# Patient Record
Sex: Male | Born: 1990 | Hispanic: Yes | Marital: Married | Smoking: Never smoker
Health system: Southern US, Community
[De-identification: ages and names within clinical notes are randomized; demographics above are authoritative.]

---

## 2017-12-22 ENCOUNTER — Emergency Department: Payer: No Typology Code available for payment source

## 2017-12-22 ENCOUNTER — Encounter: Payer: Self-pay | Admitting: Emergency Medicine

## 2017-12-22 ENCOUNTER — Other Ambulatory Visit: Payer: Self-pay

## 2017-12-22 ENCOUNTER — Emergency Department
Admission: EM | Admit: 2017-12-22 | Discharge: 2017-12-23 | Disposition: A | Discharge status: Home / Self Care | Payer: No Typology Code available for payment source | Attending: Emergency Medicine | Admitting: Emergency Medicine

## 2017-12-22 DIAGNOSIS — Y990 Civilian activity done for income or pay: Secondary | ICD-10-CM | POA: Insufficient documentation

## 2017-12-22 DIAGNOSIS — W208XXA Other cause of strike by thrown, projected or falling object, initial encounter: Secondary | ICD-10-CM | POA: Insufficient documentation

## 2017-12-22 DIAGNOSIS — Y939 Activity, unspecified: Secondary | ICD-10-CM | POA: Diagnosis not present

## 2017-12-22 DIAGNOSIS — Y929 Unspecified place or not applicable: Secondary | ICD-10-CM | POA: Insufficient documentation

## 2017-12-22 DIAGNOSIS — S41112A Laceration without foreign body of left upper arm, initial encounter: Secondary | ICD-10-CM

## 2017-12-22 MED ORDER — LIDOCAINE HCL (PF) 1 % IJ SOLN
5.0000 mL | Freq: Once | INTRAMUSCULAR | Status: AC
  Administered 2017-12-22: 5 mL

## 2017-12-22 MED ORDER — LIDOCAINE HCL (PF) 1 % IJ SOLN
INTRAMUSCULAR | Status: AC
  Administered 2017-12-22: 5 mL
  Filled 2017-12-22: qty 5

## 2017-12-22 NOTE — ED Triage Notes (Signed)
Patient ambulatory to triage with steady gait, without difficulty or distress noted; pt reports being cut on left upper arm by cylinder at work; pt accomp by supervisor with [employer](Ralph Conley [phone]); workers comp profile indicates UDS required and company to provide COC form; supervisor has no form and st to use ours

## 2017-12-23 MED ORDER — CEPHALEXIN 500 MG PO CAPS: 500.0000 mg | ORAL_CAPSULE | Freq: Three times a day (TID) | ORAL | 0 refills | Status: AC

## 2017-12-23 NOTE — ED Provider Notes (Signed)
[facility] Emergency Department Provider Note  ____________________________________________  Time seen: Approximately 12:23 AM  I have reviewed the triage vital signs and the nursing notes.   HISTORY  Chief Complaint Laceration    HPI Tony Perez is a 27 y.o. male presents to the emergency department with a 2 cm superficial laceration of the left upper arm after patient reports that a cylinder dropped on him at work.  Patient denies weakness, radiculopathy or changes in sensation of the upper extremities.  His tetanus status is up-to-date.  No alleviating measures have been attempted aside from a clean dressing.   History reviewed. No pertinent past medical history.  There are no active problems to display for this patient.   History reviewed. No pertinent surgical history.  Prior to Admission medications   Medication Sig Start Date End Date Taking? Authorizing Provider  cephALEXin (KEFLEX) 500 MG capsule Take 1 capsule (500 mg total) by mouth 3 (three) times daily for 10 days. 12/23/17 01/02/18  Orvil FeilWoods, Flemon Kelty M, PA-C    Allergies Patient has no known allergies.  No family history on file.  Social History Social History   Tobacco Use  . Smoking status: Never Smoker  . Smokeless tobacco: Never Used  Substance Use Topics  . Alcohol use: Not on file  . Drug use: Not on file     Review of Systems  Constitutional: No fever/chills Eyes: No visual changes. No discharge ENT: No upper respiratory complaints. Cardiovascular: no chest pain. Respiratory: no cough. No SOB. Gastrointestinal: No abdominal pain.  No nausea, no vomiting.  No diarrhea.  No constipation. Musculoskeletal: Negative for musculoskeletal pain. Skin: Patient has left upper arm laceration.  Neurological: Negative for headaches, focal weakness or numbness.   ____________________________________________   PHYSICAL EXAM:  VITAL SIGNS: ED Triage Vitals  Enc Vitals Group      BP 12/22/17 2237 (!) 145/79     Pulse Rate 12/22/17 2237 (!) 112     Resp 12/22/17 2237 18     Temp 12/22/17 2237 98.6 F (37 C)     Temp Source 12/22/17 2237 Oral     SpO2 12/22/17 2237 99 %     Weight 12/22/17 2219 165 lb (74.8 kg)     Height 12/22/17 2219 5\' 5"  (1.651 m)     Head Circumference --      Peak Flow --      Pain Score 12/22/17 2219 6     Pain Loc --      Pain Edu? --      Excl. in GC? --      Constitutional: Alert and oriented. Well appearing and in no acute distress. Eyes: Conjunctivae are normal. PERRL. EOMI. Head: Atraumatic. Cardiovascular: Normal rate, regular rhythm. Normal S1 and S2.  Good peripheral circulation. Respiratory: Normal respiratory effort without tachypnea or retractions. Lungs CTAB. Good air entry to the bases with no decreased or absent breath sounds. Musculoskeletal: Full range of motion to all extremities. No gross deformities appreciated. Neurologic:  Normal speech and language. No gross focal neurologic deficits are appreciated.  Skin: Patient has 2 cm laceration deep to the dermis at left upper arm.  Laceration is linear in nature. Psychiatric: Mood and affect are normal. Speech and behavior are normal. Patient exhibits appropriate insight and judgement.   ____________________________________________   LABS (all labs ordered are listed, but only abnormal results are displayed)  Labs Reviewed - No data to display ____________________________________________  EKG   ____________________________________________  RADIOLOGY I personally  viewed and evaluated these images as part of my medical decision making, as well as reviewing the written report by the radiologist.  Dg Humerus Left  Result Date: 12/22/2017 CLINICAL DATA:  Patient sustained a cut in the left upper arm by cylinder at work. EXAM: LEFT HUMERUS - 2+ VIEW COMPARISON:  None. FINDINGS: There is no evidence of fracture or other focal bone lesions. Overlying bandaging is  noted of the distal left arm along the radial and dorsal aspect with soft tissue defect likely representing site of soft tissue injury/laceration along the posterior aspect of the distal left arm. No radiopaque foreign body. IMPRESSION: Soft tissue laceration of the dorsal distal left arm without radiopaque foreign body. No acute osseous abnormality. Electronically Signed   By: Tollie Eth M.D.   On: 12/22/2017 23:28    ____________________________________________    PROCEDURES  Procedure(s) performed:    Procedures  LACERATION REPAIR Performed by: Orvil Feil Authorized by: Orvil Feil Consent: Verbal consent obtained. Risks and benefits: risks, benefits and alternatives were discussed Consent given by: patient Patient identity confirmed: provided demographic data Prepped and Draped in normal sterile fashion Wound explored  Laceration Location: Left upper arm   Laceration Length: 2 cm  No Foreign Bodies seen or palpated  Anesthesia: local infiltration  Local anesthetic: lidocaine 1% without epinephrine  Anesthetic total: 3 ml  Irrigation method: syringe Amount of cleaning: standard  Skin closure: 4-0 Ethilon   Number of sutures: 4  Technique: Simple Interrupted   Patient tolerance: Patient tolerated the procedure well with no immediate complications.   Medications  lidocaine (PF) (XYLOCAINE) 1 % injection 5 mL (5 mLs Other Given 12/22/17 2330)     ____________________________________________   INITIAL IMPRESSION / ASSESSMENT AND PLAN / ED COURSE  Pertinent labs & imaging results that were available during my care of the patient were reviewed by me and considered in my medical decision making (see chart for details).  Review of the [place] CSRS was performed in accordance of the [facility] prior to dispensing any controlled drugs.      Assessment and Plan: Laceration Patient presents to the emergency department with a 2 cm left upper arm laceration repaired  in the emergency department without complication.  Patient was advised to have sutures removed by primary care in 7 days.  He was discharged with Keflex. All patient questions were answered.     ____________________________________________  FINAL CLINICAL IMPRESSION(S) / ED DIAGNOSES  Final diagnoses:  Laceration of left upper arm, initial encounter      NEW MEDICATIONS STARTED DURING THIS VISIT:  ED Discharge Orders        Ordered    cephALEXin (KEFLEX) 500 MG capsule  3 times daily     12/23/17 0011          This chart was dictated using voice recognition software/Dragon. Despite best efforts to proofread, errors can occur which can change the meaning. Any change was purely unintentional.    Orvil Feil, PA-C 12/23/17 1610    Minna Antis, MD 12/24/17 [phone]

## 2019-11-17 ENCOUNTER — Other Ambulatory Visit: Payer: Self-pay

## 2019-11-17 ENCOUNTER — Emergency Department: Payer: Self-pay

## 2019-11-17 DIAGNOSIS — Y998 Other external cause status: Secondary | ICD-10-CM | POA: Insufficient documentation

## 2019-11-17 DIAGNOSIS — W278XXA Contact with other nonpowered hand tool, initial encounter: Secondary | ICD-10-CM | POA: Insufficient documentation

## 2019-11-17 DIAGNOSIS — S51842A Puncture wound with foreign body of left forearm, initial encounter: Secondary | ICD-10-CM | POA: Insufficient documentation

## 2019-11-17 DIAGNOSIS — Y9269 Other specified industrial and construction area as the place of occurrence of the external cause: Secondary | ICD-10-CM | POA: Insufficient documentation

## 2019-11-17 DIAGNOSIS — Y9389 Activity, other specified: Secondary | ICD-10-CM | POA: Insufficient documentation

## 2019-11-17 NOTE — ED Notes (Signed)
Pt's supervisor Edison Nasuti [phone] reports pt does not need UDS

## 2019-11-17 NOTE — ED Triage Notes (Signed)
Patient reports injury occurred at work, he was stamping a piece of metal when a piece broke off and shot into his left arm.

## 2019-11-18 ENCOUNTER — Emergency Department
Admission: EM | Admit: 2019-11-18 | Discharge: 2019-11-18 | Disposition: A | Discharge status: Home / Self Care | Payer: Self-pay | Attending: Emergency Medicine | Admitting: Emergency Medicine

## 2019-11-18 DIAGNOSIS — S40852A Superficial foreign body of left upper arm, initial encounter: Secondary | ICD-10-CM

## 2019-11-18 MED ORDER — CEPHALEXIN 500 MG PO CAPS
500.0000 mg | ORAL_CAPSULE | Freq: Once | ORAL | Status: AC
  Administered 2019-11-18: 500 mg via ORAL

## 2019-11-18 MED ORDER — CEPHALEXIN 500 MG PO CAPS: 500.0000 mg | ORAL_CAPSULE | Freq: Two times a day (BID) | ORAL | 0 refills | Status: DC

## 2019-11-18 NOTE — ED Notes (Signed)
ED Provider at bedside. 

## 2019-11-18 NOTE — ED Provider Notes (Signed)
[facility] Emergency Department Provider Note  Time seen: 12:57 AM  I have reviewed the triage vital signs and the nursing notes.   HISTORY  Chief Complaint Foreign Body   HPI Shields Pautz is a 29 y.o. male with no significant past medical history presents to the emergency department for a injury at work.  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  While he was attempting to hammer/stamping number into the lumen of take a piece of the aluminum tank broke off and shot into his left arm.  States mild bleeding after the event.  Punctate wound to his left arm.  Patient states last tetanus shot was approximately 2 years ago.  No discomfort.  No past medical history on file.  There are no problems to display for this patient.   No past surgical history on file.  Prior to Admission medications   Not on File    No Known Allergies  No family history on file.  Social History Social History   Tobacco Use  . Smoking status: Never Smoker  . Smokeless tobacco: Never Used  Substance Use Topics  . Alcohol use: Not on file  . Drug use: Not on file    Review of Systems Constitutional: Negative for fever. Cardiovascular: Negative for chest pain. Respiratory: Negative for shortness of breath. Gastrointestinal: Negative for abdominal pain Musculoskeletal: Wound to left arm Skin: Wound to left arm All other ROS negative  ____________________________________________   PHYSICAL EXAM:  VITAL SIGNS: ED Triage Vitals  Enc Vitals Group     BP 11/17/19 2330 (!) 138/91     Pulse Rate 11/17/19 2330 (!) 108     Resp 11/17/19 2330 18     Temp 11/17/19 2330 99.4 F (37.4 C)     Temp Source 11/17/19 2330 Oral     SpO2 11/17/19 2330 98 %     Weight 11/17/19 2328 165 lb (74.8 kg)     Height 11/17/19 2328 5\' 5"  (1.651 m)     Head Circumference --      Peak Flow --       Pain Score 11/17/19 2328 7     Pain Loc --      Pain Edu? --      Excl. in GC? --    Constitutional: Alert and oriented. Well appearing and in no distress. Eyes: Normal exam ENT      Head: Normocephalic and atraumatic.      Mouth/Throat: Mucous membranes are moist. Cardiovascular: Normal rate, regular rhythm Respiratory: Normal respiratory effort without tachypnea nor retractions. Breath sounds are clear  Gastrointestinal: Soft and nontender. No distention.  Musculoskeletal: Patient has a punctate wound to the medial left forearm just distal to the elbow.  Neurovascular intact. Neurologic:  Normal speech and language. No gross focal neurologic deficits  Skin:  Skin is warm.  Punctate wound as described above hemostatic. Psychiatric: Mood and affect are normal.       RADIOLOGY  X-ray shows very small metallic foreign body.  ____________________________________________   INITIAL IMPRESSION / ASSESSMENT AND PLAN / ED COURSE  Pertinent labs & imaging results that were available during my care of the patient were reviewed by me and considered in my medical decision making (see chart for details).   Patient presents after an injury at work in which a small piece of aluminum shot off into the patient's arm  from the machine he was working on.  It is a very small piece of aluminum on x-ray.  Punctate wound to the patient's skin.  Hemostatic.  Given the depth and size of the foreign body should not cause issues in the future.  We will leave as is.  We will cover the short course of antibiotics.  Patient's tetanus is up-to-date.  Patient agreeable to plan of care.  I discussed return precautions.  Savaughn Karwowski was evaluated in Emergency Department on 11/18/2019 for the symptoms described in the history of present illness. He was evaluated in the context of the global COVID-19 pandemic, which necessitated consideration that the patient might be at risk for infection with the SARS-CoV-2  virus that causes COVID-19. Institutional protocols and algorithms that pertain to the evaluation of patients at risk for COVID-19 are in a state of rapid change based on information released by regulatory bodies including the CDC and federal and state organizations. These policies and algorithms were followed during the patient's care in the ED.  ____________________________________________   FINAL CLINICAL IMPRESSION(S) / ED DIAGNOSES  Puncture wound   Minna Antis, MD 11/18/19 0100

## 2021-01-15 IMAGING — CR DG FOREARM 2V*L*
1 series · 2 of 2 positions shown · non-contrast
Comparison: None.

CLINICAL DATA: Metallic foreign body

EXAM:
LEFT FOREARM - 2 VIEW

[Series 1: dg forearm left · 0.14mm/px · 2 of 2 slices shown]
[im 1/2]
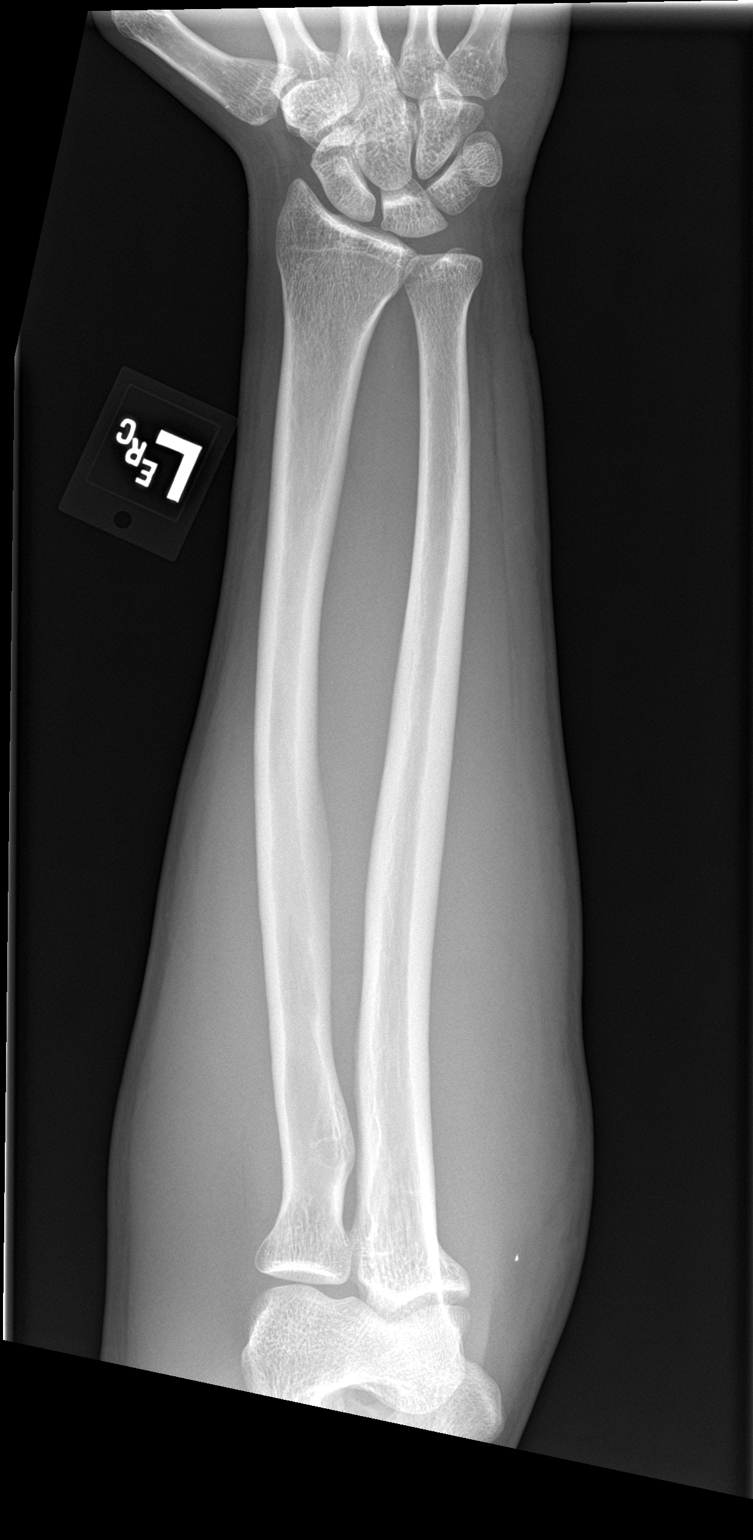
[im 2/2]
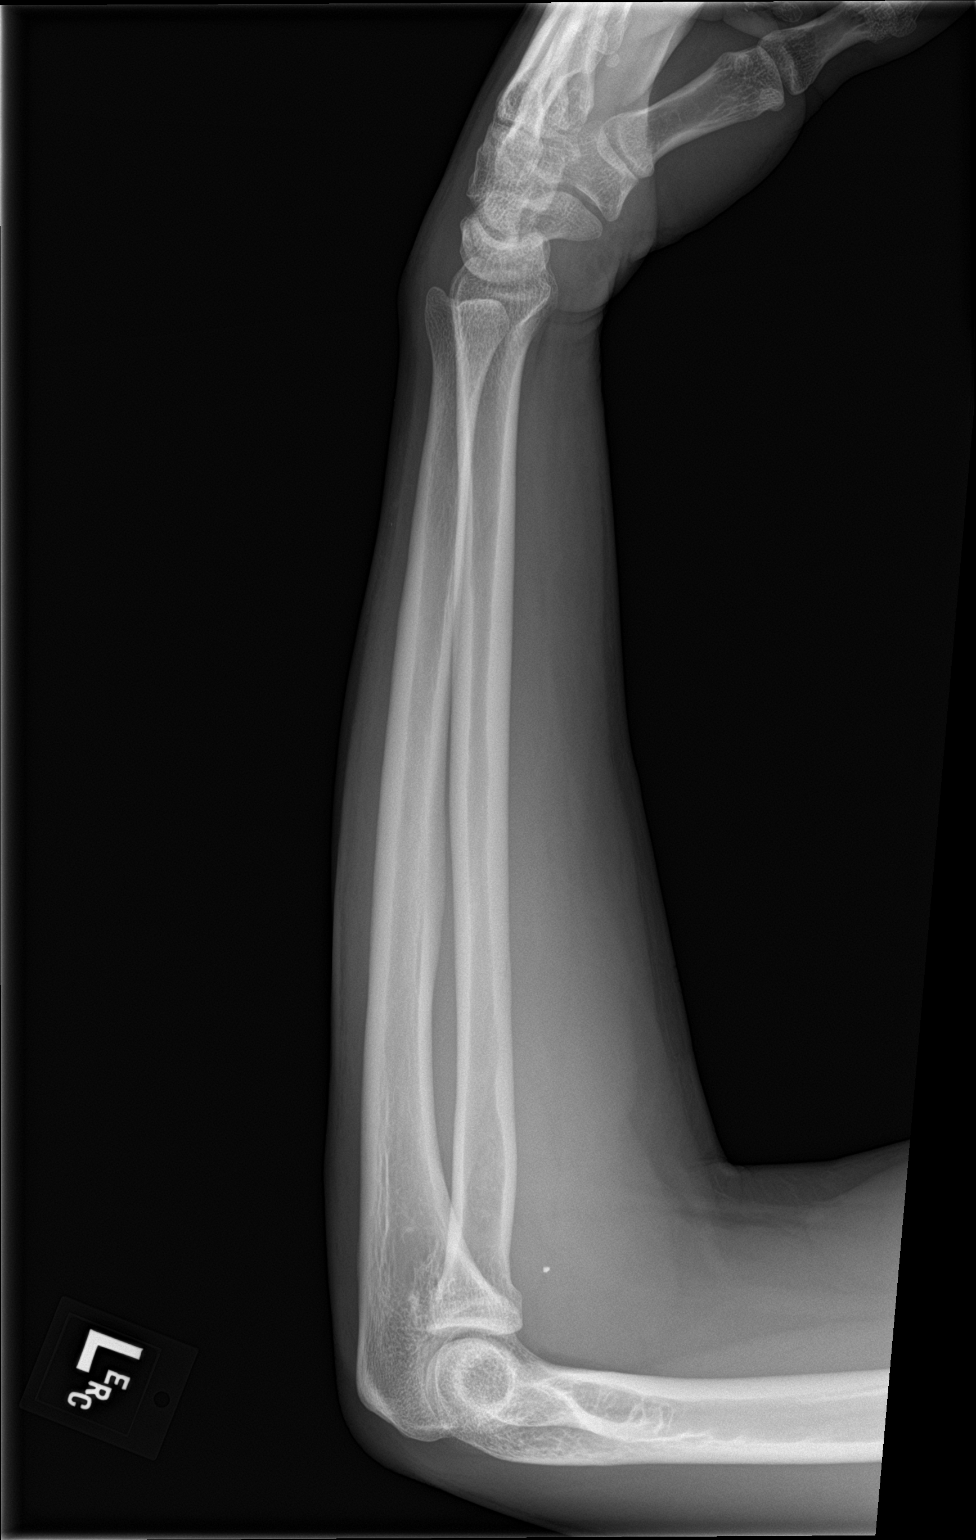

[2 of 2 positions shown; findings below may reference images not displayed]

FINDINGS: Punctate radiodensity seen along the volar medial tissues of the
proximal forearm with associated soft tissue thickening compatible
with reported foreign body and soft tissue injury. No soft tissue
gas. No acute bony abnormality. Specifically, no fracture,
subluxation, or dislocation.
IMPRESSION: Punctate metallic foreign body in the volar medial tissues of the
proximal forearm with associated soft tissue thickening.

## 2021-12-24 ENCOUNTER — Emergency Department: Payer: Self-pay

## 2021-12-24 ENCOUNTER — Emergency Department
Admission: EM | Admit: 2021-12-24 | Discharge: 2021-12-24 | Disposition: A | Discharge status: Home / Self Care | Payer: Self-pay | Attending: Emergency Medicine | Admitting: Emergency Medicine

## 2021-12-24 DIAGNOSIS — S51811A Laceration without foreign body of right forearm, initial encounter: Secondary | ICD-10-CM | POA: Insufficient documentation

## 2021-12-24 DIAGNOSIS — S61411A Laceration without foreign body of right hand, initial encounter: Secondary | ICD-10-CM | POA: Insufficient documentation

## 2021-12-24 DIAGNOSIS — Z23 Encounter for immunization: Secondary | ICD-10-CM | POA: Insufficient documentation

## 2021-12-24 DIAGNOSIS — W540XXA Bitten by dog, initial encounter: Secondary | ICD-10-CM | POA: Insufficient documentation

## 2021-12-24 MED ORDER — OXYCODONE-ACETAMINOPHEN 5-325 MG PO TABS
2.0000 | ORAL_TABLET | Freq: Once | ORAL | Status: AC
  Administered 2021-12-24: 2 via ORAL
  Filled 2021-12-24: qty 2

## 2021-12-24 MED ORDER — AMOXICILLIN-POT CLAVULANATE 875-125 MG PO TABS: 1.0000 | ORAL_TABLET | Freq: Two times a day (BID) | ORAL | 0 refills | Status: AC

## 2021-12-24 MED ORDER — TETANUS-DIPHTH-ACELL PERTUSSIS 5-2.5-18.5 LF-MCG/0.5 IM SUSY
0.5000 mL | PREFILLED_SYRINGE | Freq: Once | INTRAMUSCULAR | Status: AC
  Administered 2021-12-24: 0.5 mL via INTRAMUSCULAR
  Filled 2021-12-24: qty 0.5

## 2021-12-24 MED ORDER — LIDOCAINE-EPINEPHRINE (PF) 2 %-1:200000 IJ SOLN
10.0000 mL | Freq: Once | INTRAMUSCULAR | Status: AC
  Administered 2021-12-24: 10 mL
  Filled 2021-12-24: qty 20

## 2021-12-24 MED ORDER — OXYCODONE-ACETAMINOPHEN 5-325 MG PO TABS: 1.0000 | ORAL_TABLET | Freq: Four times a day (QID) | ORAL | 0 refills | Status: AC | PRN

## 2021-12-24 NOTE — ED Triage Notes (Signed)
PT arrives via POV. Pt was at his mothers house putting away her dog when the pt was attacked. Animal control was called and came to the scene. Per pt, the dog does not have any of its shots.

## 2021-12-24 NOTE — ED Notes (Signed)
First nurse note-pt brought in by ems with dog bite to right forearm and right hand.  Animal control on the scene per ems.  Puncture wounds to arm.  Bleeding controlled.  Pt took an ativan at home.  Bp 148/96, p-106, sats 98% per ems.

## 2021-12-24 NOTE — ED Provider Triage Note (Signed)
  Emergency Medicine Provider Triage Evaluation Note  Tony Perez , a 30 y.o.male,  was evaluated in triage.  Pt complains of dog bite to the right upper extremity.  Patient states that he was bitten multiple times by a dog who is not up-to-date on shots his right hand, wrist, and forearm.   Review of Systems  Positive: Right hand/wrist/forearm pain. Negative: Denies fever, chest pain, vomiting  Physical Exam  There were no vitals filed for this visit. Gen:   Awake, no distress   Resp:  Normal effort  MSK:   Moves extremities without difficulty  Other:  Right hand/forearm currently wrapped in bandage.  No active bleeding at this time.  Medical Decision Making  Given the patient's initial medical screening exam, the following diagnostic evaluation has been ordered. The patient will be placed in the appropriate treatment space, once one is available, to complete the evaluation and treatment. I have discussed the plan of care with the patient and I have advised the patient that an ED physician or mid-level practitioner will reevaluate their condition after the test results have been received, as the results may give them additional insight into the type of treatment they may need.    Diagnostics: X-rays of right hand/forearm  Treatments: none immediately   Varney Daily, [place] 12/24/21 1836

## 2021-12-24 NOTE — Discharge Instructions (Addendum)
-  Take all of your antibiotics as prescribed. You may apply a topical antibiotic ointment daily to the open wounds and to the sutures. Do not apply it to the glue sites.  -You may take tylenol/ibuprofen as needed for pain  -Return to the emergency department at any time if you begin to experience any new or worsening symptoms.

## 2021-12-24 NOTE — ED Provider Notes (Signed)
[facility] Provider Note    None    (approximate)   History   Chief Complaint Animal Bite   HPI Tony Perez is a 31 y.o. male, no significant medical history, presents emergency department for evaluation of animal bite.  Patient states that he was bit by his mother's dog on his right hand/forearm earlier today.  He is unsure if the dog is up-to-date on its shots, however it is available for quarantine and can be observed.  Dog's been otherwise healthy.  Denies any other injuries.  He is not up-to-date on his tetanus vaccination.  History Limitations: No limitations.        Physical Exam  Triage Vital Signs: ED Triage Vitals  Enc Vitals Group     BP 12/24/21 1853 137/87     Pulse Rate 12/24/21 1853 81     Resp 12/24/21 1853 18     Temp 12/24/21 1853 99.1 F (37.3 C)     Temp Source 12/24/21 1853 Oral     SpO2 12/24/21 1853 99 %     Weight 12/24/21 1854 174 lb (78.9 kg)     Height --      Head Circumference --      Peak Flow --      Pain Score 12/24/21 1854 10     Pain Loc --      Pain Edu? --      Excl. in GC? --     Most recent vital signs: Vitals:   12/24/21 1853  BP: 137/87  Pulse: 81  Resp: 18  Temp: 99.1 F (37.3 C)  SpO2: 99%    General: Awake, NAD.  Skin: Warm, dry. No rashes or lesions.  Eyes: PERRL. Conjunctivae normal.  CV: Good peripheral perfusion.  Resp: Normal effort.  Abd: Soft, non-tender. No distention.  Neuro: At baseline. No gross neurological deficits.   Focused Exam: Several 1 mm puncture wound appreciated along the midshaft and distal forearm, as well as the hand.  A single 2 cm laceration on the thenar eminence of the right hand.  2 other 1 cm linear lacerations on the distal forearm.  No active bleeding or discharge.  No evidence of foreign bodies.  PMS intact distally.   Physical Exam    ED Results / Procedures / Treatments  Labs (all labs ordered are listed, but only abnormal results are  displayed) Labs Reviewed - No data to display   EKG N/A.   RADIOLOGY  ED Provider Interpretation: I personally viewed and interpreted these x-rays, no evidence of foreign bodies or osseous abnormalities.  DG Hand Complete Right  Result Date: 12/24/2021 CLINICAL DATA:  Dog bite, multiple puncture wounds EXAM: RIGHT HAND - COMPLETE 3+ VIEW; RIGHT FOREARM - 2 VIEW COMPARISON:  None Available. FINDINGS: No acute fracture or dislocation in the hand, wrist, or forearm.The joint spaces are preserved.Alignment is unremarkable.No foreign body. Small foci of air within the soft tissues of the hand and forearm. IMPRESSION: No acute osseous abnormality.  No foreign body. Electronically Signed   By: Wiliam Ke M.D.   On: 12/24/2021 19:20   DG Forearm Right  Result Date: 12/24/2021 CLINICAL DATA:  Dog bite, multiple puncture wounds EXAM: RIGHT HAND - COMPLETE 3+ VIEW; RIGHT FOREARM - 2 VIEW COMPARISON:  None Available. FINDINGS: No acute fracture or dislocation in the hand, wrist, or forearm.The joint spaces are preserved.Alignment is unremarkable.No foreign body. Small foci of air within the soft tissues of the hand and forearm.  IMPRESSION: No acute osseous abnormality.  No foreign body. Electronically Signed   By: Wiliam Ke M.D.   On: 12/24/2021 19:20    PROCEDURES:  Critical Care performed: N/A.  Marland Kitchen.Laceration Repair  Date/Time: 12/24/2021 9:29 PM  Performed by: Varney Daily, PA Authorized by: Varney Daily, PA   Consent:    Consent obtained:  Verbal   Consent given by:  Patient   Risks, benefits, and alternatives were discussed: yes     Risks discussed:  Infection, pain, retained foreign body, poor cosmetic result, vascular damage and tendon damage   Alternatives discussed:  No treatment Universal protocol:    Patient identity confirmed:  Verbally with patient Anesthesia:    Anesthesia method:  Local infiltration   Local anesthetic:  Lidocaine 2% WITH epi Laceration  details:    Location:  Hand   Hand location:  R palm   Length (cm):  2   Depth (mm):  2 Pre-procedure details:    Preparation:  Patient was prepped and draped in usual sterile fashion Exploration:    Hemostasis achieved with:  Direct pressure   Wound exploration: wound explored through full range of motion and entire depth of wound visualized     Wound extent: no fascia violation noted, no foreign bodies/material noted, no muscle damage noted, no tendon damage noted, no underlying fracture noted and no vascular damage noted     Contaminated: no   Treatment:    Area cleansed with:  Soap and water   Amount of cleaning:  Extensive   Irrigation solution:  Tap water   Irrigation volume:  3000 ml   Irrigation method:  Tap   Debridement:  None   Undermining:  None Skin repair:    Repair method:  Sutures   Suture size:  5-0   Suture material:  Nylon   Suture technique:  Simple interrupted   Number of sutures:  3 Approximation:    Approximation:  Close Repair type:    Repair type:  Simple Post-procedure details:    Dressing:  Sterile dressing and tube gauze   Procedure completion:  Tolerated well, no immediate complications .Marland KitchenLaceration Repair  Date/Time: 12/24/2021 9:30 PM  Performed by: Varney Daily, PA Authorized by: Varney Daily, PA   Consent:    Consent obtained:  Verbal   Consent given by:  Patient   Risks, benefits, and alternatives were discussed: yes     Risks discussed:  Infection, pain, retained foreign body and poor cosmetic result   Alternatives discussed:  No treatment Universal protocol:    Patient identity confirmed:  Verbally with patient Anesthesia:    Anesthesia method:  None Laceration details:    Location:  Shoulder/arm   Shoulder/arm location:  R lower arm   Length (cm):  1   Depth (mm):  1 Pre-procedure details:    Preparation:  Patient was prepped and draped in usual sterile fashion Exploration:    Hemostasis achieved with:  Direct  pressure   Wound extent: no fascia violation noted, no foreign bodies/material noted, no muscle damage noted, no tendon damage noted, no underlying fracture noted and no vascular damage noted   Treatment:    Area cleansed with:  Soap and water   Amount of cleaning:  Extensive   Irrigation solution:  Tap water   Irrigation volume:  1000 ml   Irrigation method:  Tap   Debridement:  None   Undermining:  None Skin repair:    Repair method:  Tissue adhesive  Approximation:    Approximation:  Close Repair type:    Repair type:  Simple Post-procedure details:    Dressing:  Tube gauze and sterile dressing   Procedure completion:  Tolerated well, no immediate complications     MEDICATIONS ORDERED IN ED: Medications  Tdap (BOOSTRIX) injection 0.5 mL (has no administration in time range)  lidocaine-EPINEPHrine (XYLOCAINE W/EPI) 2 %-1:200000 (PF) injection 10 mL (has no administration in time range)  oxyCODONE-acetaminophen (PERCOCET/ROXICET) 5-325 MG per tablet 2 tablet (2 tablets Oral Given 12/24/21 2056)     IMPRESSION / MDM / ASSESSMENT AND PLAN / ED COURSE  I reviewed the triage vital signs and the nursing notes.                              Differential diagnosis includes, but is not limited to, dog bite, foreign body, tetanus, rabies  Assessment/Plan Patient presents with several dog bites to the right forearm/hand.  Dog is available for quarantine for 10 days and can be observed.  No indication for rabies prophylaxis at this time.  Provided him with a tetanus immunization.  Cleanse the wounds extensively and repaired wounds with open subcutaneous tissue, including the one on his hand which was repaired with sutures, and 2 of the other lacerations with Dermabond glue.  The remaining puncture wounds will remain open to heal via secondary intention.  X-rays reassuring for no evidence of foreign bodies or fractures.  Will discharge with prescription for Augmentin and  oxycodone/acetaminophen.  Instructed him to return to the emergency department or see his regular doctor for suture removal in 7 to 10 days.  Will discharge  Provided the patient with anticipatory guidance, return precautions, and educational material. Encouraged the patient to return to the emergency department at any time if they begin to experience any new or worsening symptoms. Patient expressed understanding and agreed with the plan.   Patient's presentation is most consistent with acute complicated illness / injury requiring diagnostic workup.       FINAL CLINICAL IMPRESSION(S) / ED DIAGNOSES   Final diagnoses:  Dog bite, initial encounter     Rx / DC Orders   ED Discharge Orders          Ordered    amoxicillin-clavulanate (AUGMENTIN) 875-125 MG tablet  2 times daily        12/24/21 2119    oxyCODONE-acetaminophen (PERCOCET) 5-325 MG tablet  Every 6 hours PRN        12/24/21 2119             Note:  This document was prepared using Dragon voice recognition software and may include unintentional dictation errors.   Varney Daily, [place] 12/24/21 2133    Chesley Noon, MD 12/25/21 [phone]
